# Patient Record
Sex: Female | Born: 1955
Health system: Southern US, Community
[De-identification: ages and names within clinical notes are randomized; demographics above are authoritative.]

## PROBLEM LIST (undated history)

## (undated) DIAGNOSIS — I341 Nonrheumatic mitral (valve) prolapse: Secondary | ICD-10-CM

## (undated) DIAGNOSIS — IMO0002 Reserved for concepts with insufficient information to code with codable children: Secondary | ICD-10-CM

## (undated) DIAGNOSIS — K219 Gastro-esophageal reflux disease without esophagitis: Secondary | ICD-10-CM

## (undated) DIAGNOSIS — M329 Systemic lupus erythematosus, unspecified: Secondary | ICD-10-CM

## (undated) DIAGNOSIS — G9050 Complex regional pain syndrome I, unspecified: Secondary | ICD-10-CM

---

## 2001-08-05 ENCOUNTER — Ambulatory Visit (HOSPITAL_COMMUNITY): Admission: RE | Admit: 2001-08-05 | Discharge: 2001-08-05 | Payer: Self-pay | Admitting: Family Medicine

## 2001-08-05 ENCOUNTER — Encounter: Payer: Self-pay | Admitting: Family Medicine

## 2001-09-13 ENCOUNTER — Encounter: Payer: Self-pay | Admitting: Family Medicine

## 2001-09-13 ENCOUNTER — Ambulatory Visit (HOSPITAL_COMMUNITY): Admission: RE | Admit: 2001-09-13 | Discharge: 2001-09-13 | Payer: Self-pay | Admitting: Family Medicine

## 2001-10-31 ENCOUNTER — Encounter: Payer: Self-pay | Admitting: Emergency Medicine

## 2001-10-31 ENCOUNTER — Emergency Department (HOSPITAL_COMMUNITY): Admission: EM | Admit: 2001-10-31 | Discharge: 2001-10-31 | Payer: Self-pay | Admitting: Emergency Medicine

## 2002-09-19 ENCOUNTER — Emergency Department (HOSPITAL_COMMUNITY): Admission: EM | Admit: 2002-09-19 | Discharge: 2002-09-19 | Payer: Self-pay | Admitting: Emergency Medicine

## 2004-04-29 ENCOUNTER — Inpatient Hospital Stay (HOSPITAL_COMMUNITY): Admission: EM | Admit: 2004-04-29 | Discharge: 2004-05-01 | Payer: Self-pay | Admitting: Emergency Medicine

## 2005-03-17 ENCOUNTER — Encounter: Admission: RE | Admit: 2005-03-17 | Discharge: 2005-03-17 | Payer: Self-pay | Admitting: *Deleted

## 2005-04-27 ENCOUNTER — Encounter: Admission: RE | Admit: 2005-04-27 | Discharge: 2005-04-27 | Payer: Self-pay | Admitting: Orthopedic Surgery

## 2005-05-27 ENCOUNTER — Ambulatory Visit (HOSPITAL_BASED_OUTPATIENT_CLINIC_OR_DEPARTMENT_OTHER): Admission: RE | Admit: 2005-05-27 | Discharge: 2005-05-27 | Payer: Self-pay | Admitting: Orthopedic Surgery

## 2005-10-13 ENCOUNTER — Encounter: Admission: RE | Admit: 2005-10-13 | Discharge: 2005-10-13 | Payer: Self-pay | Admitting: Orthopedic Surgery

## 2006-09-07 ENCOUNTER — Encounter: Admission: RE | Admit: 2006-09-07 | Discharge: 2006-09-07 | Payer: Self-pay | Admitting: Orthopedic Surgery

## 2007-01-19 IMAGING — RF DG FL GUIDE NDL PLMT  - NRPT [facility]
4 series · 4 of 4 positions shown · IV contrast (omniscan)
Comparison: none

CLINICAL DATA: Right wrist pain.  
 RIGHT WRIST INJECTION FOR MRI:
 The skin dorsal to the wrist was cleansed with Betadine and draped in a sterile fashion.  The patient has an allergy to anesthetic agents and none was given.  A 25 gauge needle was placed on one pass into radiocarpal joint between the navicular and the radial styloid.  Kye mixed with equal part normal saline and a few drops of Omniscan was injected to fill the radiocarpal joint.  Contrast flows rather freely into the distal radioulnar joint, indicating a triangular fibrocartilage tear.  No contrast penetrates the mid carpal joint at this point.

[Series 1: arthrogram · 1 of 1 slices shown (1 of 4)]
[im 1/1]
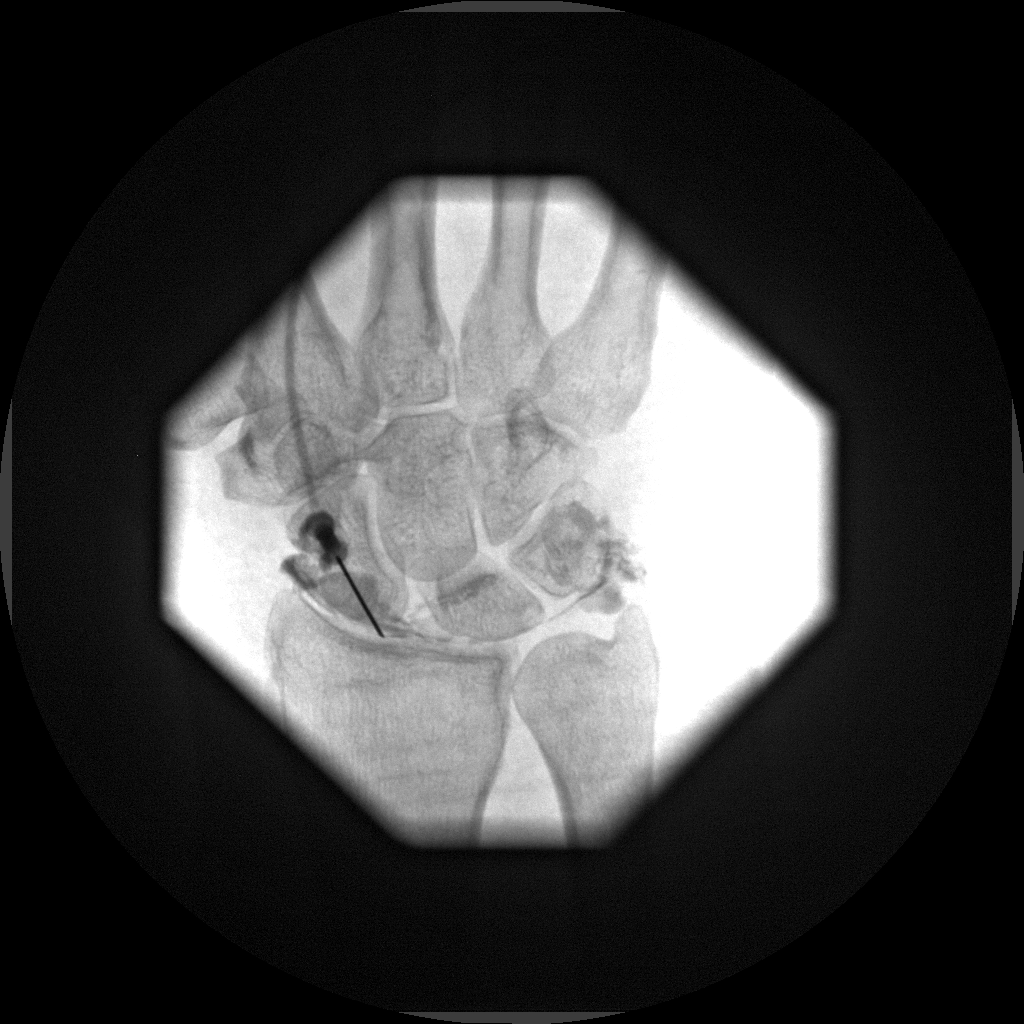

[Series 2: arthrogram · 1 of 1 slices shown (2 of 4)]
[im 1/1]
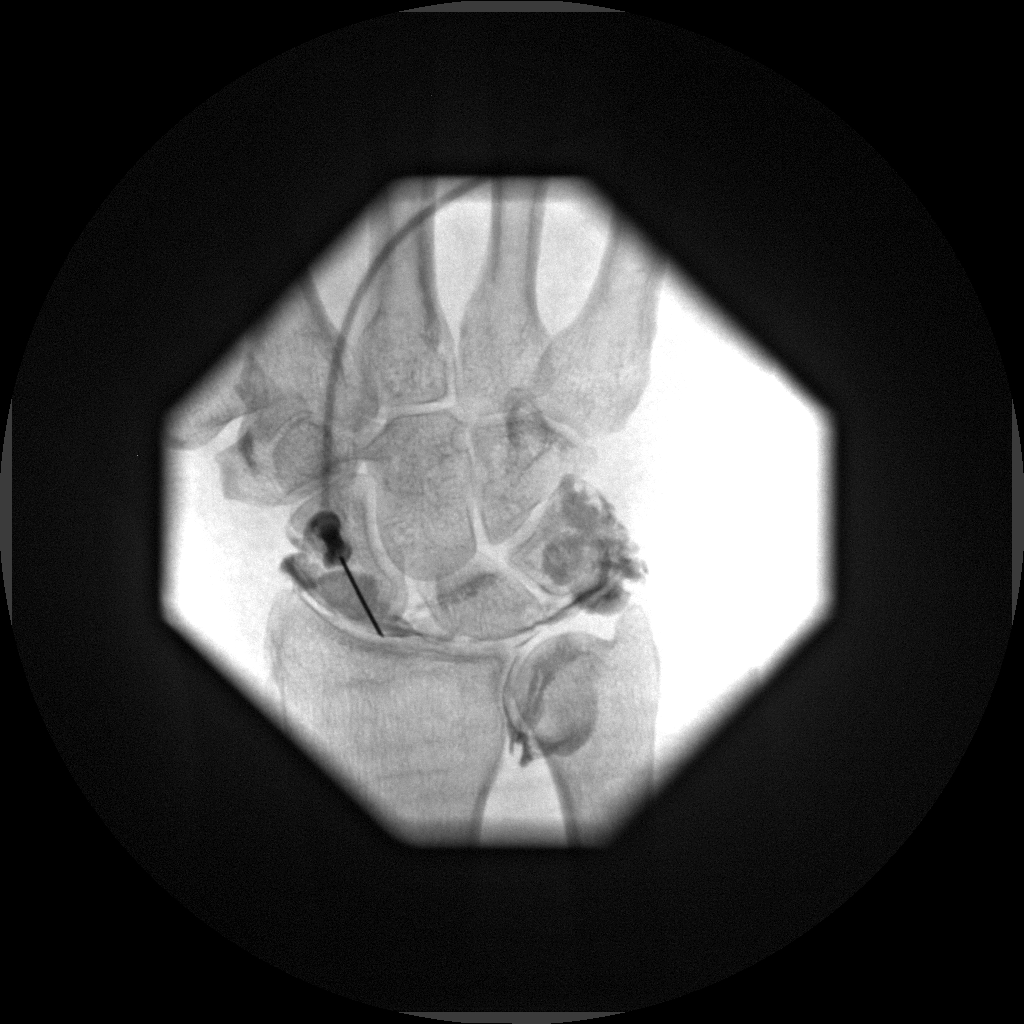

[Series 3: arthrogram · 1 of 1 slices shown (3 of 4)]
[im 1/1]
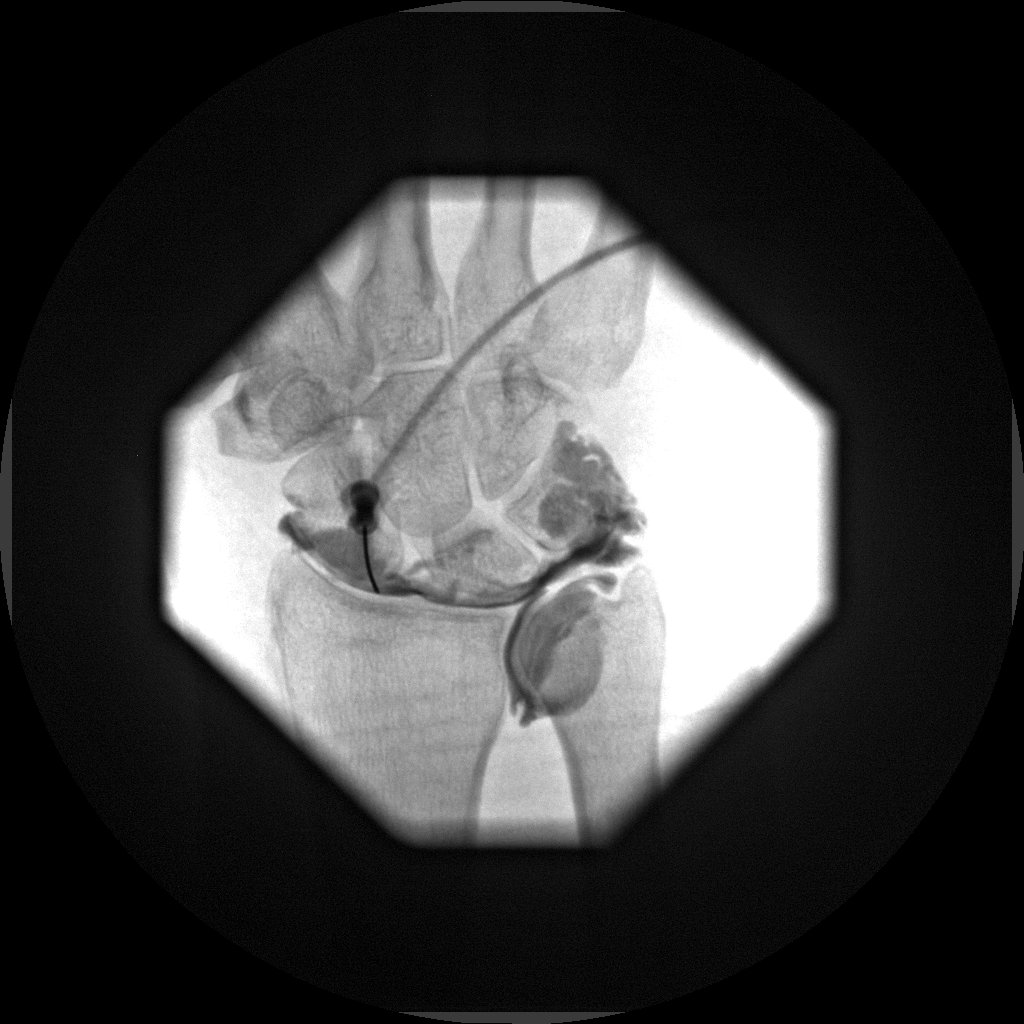

[Series 4: arthrogram · 1 of 1 slices shown (4 of 4)]
[im 1/1]
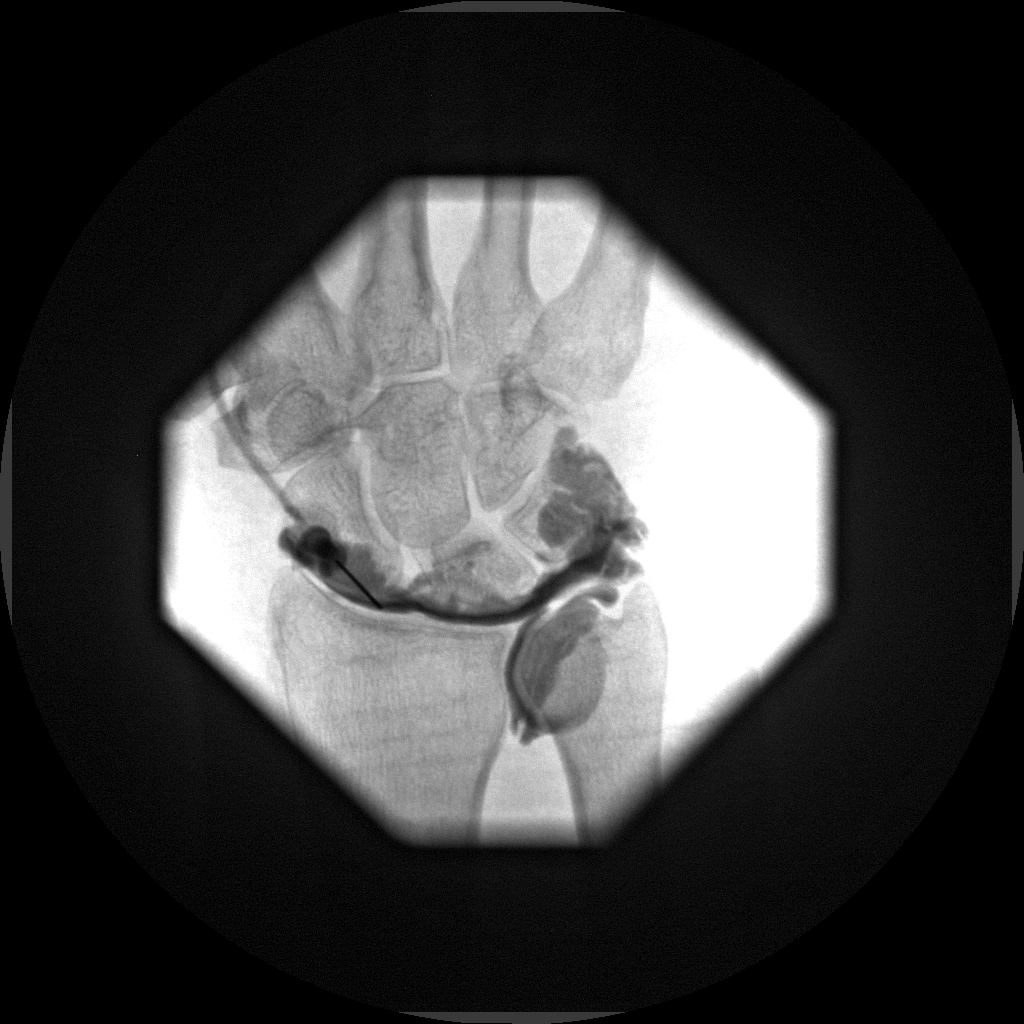

[4 of 4 positions shown; findings below may reference images not displayed]

IMPRESSION: Injection for MRI demonstrating a triangular fibrocartilage tear.

## 2009-10-24 ENCOUNTER — Ambulatory Visit (HOSPITAL_BASED_OUTPATIENT_CLINIC_OR_DEPARTMENT_OTHER): Admission: RE | Admit: 2009-10-24 | Discharge: 2009-10-24 | Payer: Self-pay | Admitting: Orthopedic Surgery

## 2010-05-15 LAB — POCT HEMOGLOBIN-HEMACUE: Hemoglobin: 13.8 g/dL (ref 12.0–15.0)

## 2010-05-15 LAB — BASIC METABOLIC PANEL
CO2: 30 mEq/L (ref 19–32)
GFR calc non Af Amer: >60 mL/min (ref >60)
Sodium: 139 mEq/L (ref 135–145)

## 2010-07-18 NOTE — Op Note (Signed)
NAMEKENNIDI, [name]              ACCOUNT NO.:  [id number]   MEDICAL RECORD NO.:  [id number]          PATIENT TYPE:  AMB   LOCATION:  DSC                          FACILITY:  [facility]   PHYSICIAN:  Loreta Ave, M.D. DATE OF BIRTH:  [birth year]   DATE OF PROCEDURE:  05/27/2005  DATE OF DISCHARGE:                                 OPERATIVE REPORT   PREOPERATIVE DIAGNOSIS:  Torn triangular fibrocartilage complex, right  wrist.   POSTOPERATIVE DIAGNOSIS:  Torn triangular fibrocartilage complex, right  wrist, with osteochondral fracture, 2-3 mm dorsal aspect of the lunate,  healed sprain scapholunate ligament with evident stability.   OPERATIVE PROCEDURE:  Right wrist exam under anesthesia, arthroscopy,  debridement of osteochondral injury lunate, removal loose body, debridement  of triangular fibrocartilage complex tear.   SURGEON:  Loreta Ave, M.D.   ASSISTANT:  Genene Churn. Barry Dienes, P.A.-C.   ANESTHESIA:  General.   ESTIMATED BLOOD LOSS:  Minimal.   SPECIMENS:  None.   CULTURES:  None.   COMPLICATIONS:  None.   DRESSINGS:  Soft compressive with a splint.   TOURNIQUET TIME:  50 minutes.   PROCEDURE:  The patient was brought to the operating room and after adequate  anesthesia had been obtained, the wrist was examined.  Good motion and  stability, no crepitus.  Tourniquet applied.  Prepped and draped in the  usual sterile fashion.  Distraction tower applied.  Exsanguinated with  elevation of Esmarch and tourniquet inflated to 250 mmHg.  15 pounds  distraction.  Two dorsal portals, one between the third and fourth  compartment, the other between the fifth and sixth compartment, avoiding  tendons and neurovascular structures.  The wrist entered with a blunt  obturator through both portals.  The arthroscope was introduced, wrist  distended and inspected.  2-3 mm partially tethered osteochondral injury  dorsal aspect of the lunate debrided.  Loose body removed.  Good  bleeding  bone at the base.  This was not repairable.  Remaining lunate looked  excellent.  Some hypertrophic changes scapholunate ligament but no  instability.  Some synovitis about the wrist debrided.  Triangular  fibrocartilage complex had roughening at the radial attachment debrided but  not a full thickness tear or separation there.  Centrally and toward the  ulnar styloid, there was a complex full thickness tear debrided out to  stable margins leaving a 3-4 mm defect centrally, but this was stable.  Through this, the end of the ulna inspected with some grade 3 changes  debrided.  The entire wrist examined and no other findings appreciated.  Instruments and fluid removed.  The portals  were closed with nylon.  A Sterile compressive dressing applied.  Tourniquet  deflated and removed.  Short arm splint applied.  Anesthesia reversed.  Brought to the recovery room.  Tolerated the surgery well without  complications.      Loreta Ave, M.D.  Electronically Signed     DFM/MEDQ  D:  05/27/2005  T:  05/28/2005  Job:  [id number]

## 2010-07-18 NOTE — Discharge Summary (Signed)
[name], [name]              ACCOUNT NO.:  [id number]   MEDICAL RECORD NO.:  [id number]          PATIENT TYPE:  INP   LOCATION:  0354                         FACILITY:  [facility]   PHYSICIAN:  Jackie Plum, M.D.DATE OF BIRTH:  1955-06-13   DATE OF ADMISSION:  04/29/2004  DATE OF DISCHARGE:                                 DISCHARGE SUMMARY   DISCHARGE DIAGNOSES:  1.  Hematochezia with abnormal CT scan, rule out colitis. CT scan of the      abdomen and pelvis obtained on April 29, 2004 indicated marked bowel      wall thickening associated with the distal transverse colon, splenic      flexure, and left colon.  2.  A 3.5 cm size cystic area noted in the right breast. Outpatient      mammogram to be scheduled for the patient and needs to be followed up by      the patient's primary care physician.  3.  A 2 cm left ovarian cyst per CT scan, stable; outpatient follow-up      recommended.  4.  Mild normocytic anemia secondary to diagnosis #1, stable; outpatient      follow-up recommended.  5.  History of mitral valve prolapse, reflex sympathetic dystrophy, asthma,      arthritis, torn rotator cuff, and previous history of myocardial      infarction.   DISCHARGE MEDICATIONS:  New medicines are:  1.  Nu-Iron 150 mg daily.  2.  Protonix 40 mg daily.   The patient is to resume all her preadmission medications.   DISCHARGE LABORATORY DATA:  Wbc count 12.6, hemoglobin 11.0, hematocrit  31.4, MCV 84.8, platelet count 388. Sodium 137, potassium 3.6, chloride 104,  CO2 27, glucose 122, BUN 4, creatinine 0.7, calcium 8.0.   PROCEDURES:  Not applicable.   CONSULTANTS:  Dr. Elnoria Howard of gastrointestinal medicine.   CONDITION ON DISCHARGE:  Improved and satisfactory. The patient is to follow  up with her PCP in 2-3 weeks. She is to call for appointment. She has been  instructed to report if she experiences any problems including fever,  abdominal pain, dark or bloody stools or  diarrhea.   REASON FOR HOSPITALIZATION:  Hematochezia.   The patient presented with bloody diarrhea which was preceded by nausea and  vomiting and some abdominal cramps. She has not had any sick contacts. There  was no known clear source of food poisoning. The patient had been treated  for sinusitis with Levaquin antibiotic. She does not have any history of  ulcerative colitis or Crohn's disease. According to the H&P by Dr. Jasmine Pang, the patient's blood pressure was 127/82, heart rate of 120,  respirations 20, saturation of 96% on room air. Her cardiac auscultation was  said to be regular rate and rhythm without any gallops or murmur. Lungs were  said to be clear to auscultation bilaterally and she was said to have a mild  tenderness in the left side with normal active bowel sounds. Her admitting  lab work was notable for BUN of 7, creatinine 0.8, and hemoglobin of 14 and  hematocrit of 33. Her platelet count was 352 and there was no coagulopathy.  CT scan of the abdomen showed bowel thickening. She was therefore admitted  for management of hematochezia.   HOSPITAL COURSE:  The patient was admitted to the hospitalist service. Her  H&H was serially monitored and there was no need for any blood transfusion.  IV fluids were given to the patient. She was monitored on telemetry. There  were no significant arrhythmias. The patient was seen in consultation by Dr.  Elnoria Howard of GI medicine. His impression was that this hematochezia may be  infectious etiology. He had offered the patient sigmoidoscopy but the  patient refused in view of concerns for her reflex sympathetic dystrophy. We  therefore obtained three sets of C. difficile testing - all of which were  negative. On rounds today, the patient feels fine and she is eager to come  home, does not have any more diarrhea or any bloody stools or melena. She  does not have any dizziness. She feels fine and does not have any weakness  at all. Her  vital signs indicates a blood pressure of 116/72, heart rate of  64, respirations 20, O2 saturation of 96% on room air. She had mild  conjunctival pallor without icterus. Lungs clear to auscultation. Abdomen is  soft, nontender. Extremities no cyanosis, no edema. She is therefore being  discharged home with outpatient follow-up. I had offered to put her on  Flagyl for presumptive C. difficile negative colitis but she declines, says  that antibiotics do not really go well with her. She has been made aware of  the fact that should she have any problems including fever or chills or  recurrence of her symptoms, she needs to call her doctor ASAP. The patient's  abnormal CT scan will need to followed, probably with a repeat CT in a  couple of weeks. She has incidental findings of breast cyst and ovarian cyst  which also needs to be followed at the outpatient level. The patient does  not have any acute complaints with respect to these incidental findings and  we believe that it will be okay for this to be followed up by her PCP, Dr.  Merri Brunette.      GO/MEDQ  D:  05/01/2004  T:  05/01/2004  Job:  [id number]   cc:   Jordan Hawks. Elnoria Howard, MD  Fax: [phone]   Dario Guardian, M.D.  [address]  [place]  [place] [address]  Fax: [phone]

## 2010-07-18 NOTE — Consult Note (Signed)
[name], [name]              ACCOUNT NO.:  [id number]   MEDICAL RECORD NO.:  [id number]          PATIENT TYPE:  INP   LOCATION:  0354                         FACILITY:  [facility]   PHYSICIAN:  Jordan Hawks. Elnoria Howard, MD    DATE OF BIRTH:  December 22, 1955   DATE OF CONSULTATION:  04/29/2004  DATE OF DISCHARGE:                                   CONSULTATION   REFERRING PHYSICIAN:  Theone Stanley, M.D.   REASON FOR CONSULTATION:  Bloody diarrhea.   HISTORY OF PRESENT ILLNESS:  This is a 55 year old white female with a past  medical history of reflux sympathetic dystrophy, mitral valve prolapse,  asthma, degenerative disk disease, torn rotator cuffs, and history of  myocardial infarction, who presents with an acute episode of bloody  diarrhea.  The patient states that one day prior to admission she  experienced some nausea and vomiting as well as abdominal cramps.  The  abdomina cramps subsequently resulted in diarrhea; however, it was only  during the day of admission that the patient was noted to have bloody  diarrhea.  She denies having any sick contacts, although she visits  frequently with her husband to her husband's physician's office.  She denies  any known food poisoning; however, she was with a group of friends and  family members at a restaurant on Sunday, but no other individuals were  affected after eating the food.  She states that the symptoms were preceded  by a flu type of symptomatology, manifesting as upper respiratory symptoms  as well as body aches.  She was recently treated with Levaquin for a  sinusitis on a 10-day course, approximately 1 to 1-1/2 weeks before this  admission.  She denies having any types of fever associated with the bloody  diarrhea, and she denies any family history of ulcerative colitis or Crohn's  disease.   PAST MEDICAL/SURGICAL HISTORY:  As stated above.   MEDICATIONS:  1.  Trazodone.  2.  Atenolol.  3.  Fexofenadine.  4.  Oxycodone.  5.   Theophylline.   ALLERGIES:  The patient has multiple allergies to ANTIBIOTICS which result  in tachycardia.   FAMILY HISTORY:  Significant for cardiac disease and diabetes.   SOCIAL HISTORY:  The patient lives with her husband in [place] and has  one child.  She denies any tobacco, alcohol, or illicit drug use.   REVIEW OF SYSTEMS:  As stated above.  Positive for generalized body aches.  No skin changes.  No chest pain.  No recent shortness of breath.  No  dysuria.  No arthralgias or myalgias.   PHYSICAL EXAMINATION:  VITAL SIGNS:  Blood pressure is 127/92, heart rate is  120, respirations 20, temperature is 96.1.  GENERAL:  Generally the patient appears to be fatigued and weak; however,  she is alert and oriented.  HEENT:  Normocephalic, atraumatic.  Extraocular muscles intact.  Pupils  equal, round, and reactive to light.  NECK:  Supple.  No lymphadenopathy.  LUNGS:  Clear to auscultation bilaterally.  CARDIOVASCULAR:  Regular rate and rhythm without murmurs, gallops, or rubs.  ABDOMEN:  Flat, soft.  Mild tenderness in the left upper quadrant and left  lower quadrant regions.  No rebound or rigidity.  Positive bowel sounds.  EXTREMITIES  No clubbing, cyanosis, or edema.  RECTAL:  Examination reveals gross blood on the examination finger, as well  as a thrombosed sternal hemorrhoid.  No palpable masses.   LABORATORY DATA:  White blood cell count is 13, hemoglobin 14.9, MCV 84.3,  platelets 352.  Sodium 137, potassium 3.7, chloride 104, CO2 23, glucose 99,  BUN 7, creatinine 0.8, albumin 3.6.  Urinalysis is negative.  AST is 18, ALT  less than 8, alkaline phosphatase 72.  Albumin is 3.6.   IMPRESSION:  1.  Hematochezia.  2.  Thrombosed external hemorrhoid.  3.  Reflux sympathetic dystrophy.  At the time I am unable to identify a      source for the acute bloody diarrhea; however, she is noted to have a      history of recent antibiotic use.  It is not unreasonable to  consider      the C. difficile colitis as a possibility of her resultant bloody      diarrhea.  The CT scan of the abdomen was reviewed and does reveal that      she has thickening of her distal transverse colon through the sigmoid.      The possibility of inflammatory bowel disease is entertained at this      time; however, given the acute nature, is likely to be infectious and      will need to be followed.  A colonoscopy versus flexible sigmoidoscopy      was offered to the patient for further evaluation of possible      inflammatory bowel disease; however, she declines at this time, stating      that her reflux sympathetic dystrophy is currently in a flare and she      does not feel that she has the strength to undergo the workup with a      colonoscopy.   PLAN:  1.  Continue with the stool studies.  2.  Monitor for any signs or symptoms of deterioration.  3.  Will follow along.      PDH/MEDQ  D:  04/29/2004  T:  04/29/2004  Job:  [id number]   cc:   Theone Stanley, MD   Cassell Clement, M.D.  [address], [address]  [place]  [place] [address]  Fax: [phone]

## 2010-07-18 NOTE — H&P (Signed)
[name], Colon              ACCOUNT NO.:  [id number]   MEDICAL RECORD NO.:  [id number]          PATIENT TYPE:  EMS   LOCATION:  ED                           FACILITY:  [facility]   PHYSICIAN:  Theone Stanley, MD   DATE OF BIRTH:  [birth year]   DATE OF ADMISSION:  04/29/2004  DATE OF DISCHARGE:                                HISTORY & PHYSICAL   CHIEF COMPLAINT:  Bloody diarrhea.   HISTORY OF PRESENT ILLNESS:  [name] is a 55 year old Caucasian  female presenting with what appears to be bloody diarrhea that occurred this  morning.  Her problem started about yesterday when she had some diarrhea,  vomiting, dry heaves.  She states this was liquidy brown, no froth, no  mucus.  At that time, she had a small amount of blood in the tissue where  she wiped herself, but nothing more.  This morning at about 9:30, she had an  episode of bloody diarrhea, this made her worry, she called her primary care  physician, Dr. Katrinka Blazing, and they stated to come to the ER.  The patient denies  being around anybody sick or eating any bad food.  This has never happened  to her before.  She recently came off Levaquin for a sinusitis infection a  week ago, she was on it for about a total of 10 days.   PAST MEDICAL HISTORY:  1.  Mitral valve prolapse.  2.  RSD.  3.  Asthma.  4.  Degenerative disk disease.  5.  Bilateral torn rotator cuffs.  6.  History of myocardial infarction.   PAST SURGICAL HISTORY:  None.   MEDICATIONS:  1.  Trazodone 50 mg one to two tabs q.h.s.  2.  Atenolol 50 mg b.i.d.  3.  Allegra 180 mg one daily.  4.  Oxycodone 10 to 30 mg q.4-6h.  5.  Theophylline 100 mg one p.o. daily.   ALLERGIES:  1.  TETRACYCLINE.  2.  PENICILLIN.  3.  MICIN GROUP OF ANTIBIOTICS.  With those, she becomes very tachycardic.  4.  OVER-THE-COUNTER DECONGESTANTS, THE ONLY THING SHE CAN TAKE IS SUDAFED.  5.  LIDOCAINE.  This causes her to break out.  6.  NOVOCAINE.  This causes her to break  out.   FAMILY HISTORY:  Significant for heart disease and diabetes.   SOCIAL HISTORY:  The patient is from [place], is married, has one grown  child who is well.  No tobacco, alcohol, or illicit drug use.   REVIEW OF SYSTEMS:  Please see HPI, otherwise negative.   PHYSICAL EXAMINATION:  VITAL SIGNS:  Blood pressure 127/92, heart rate of  120, respiratory rate of 20, saturation 96% on room air.  HEENT:  Head is normocephalic, atraumatic.  Eyes are 4 mm.  Pupils equal,  round, reactive to light.  Extraocular movements were intact.  Ears with no  discharge.  Throat clear.  No erythema.  NECK:  Supple, no lymphadenopathy, no JVD.  HEART:  Regular rate and rhythm, no murmurs, rubs, or gallops appreciated.  LUNGS:  Clear to auscultation bilaterally.  ABDOMEN:  Soft,  mild tenderness in the left side.  Positive bowel sounds.  EXTREMITIES:  No cyanosis, clubbing, or edema.  SKIN:  No rashes, lumps, or bumps.  NEUROLOGIC:  The patient is alert and oriented x3.  Nonfocal.  GENITOURINARY:  The patient had evidence of hemorrhoids.   LABORATORY DATA:  Sodium 137, potassium 3.7, chloride 104, CO2 23, glucose  99, BUN 7, creatinine 0.8, calcium 8.9, total protein 6.4, albumin 2.6.  AST  18, ALT less than 8, alkaline phosphatase 72, total bilirubin 0.9.  Urinalysis shows ketones at 40, negative nitrites, negative leukocyte  esterase.  White count at 13, hemoglobin of 14, hematocrit of 43, platelets  at 352.  The patient had a CAT scan of her abdomen which showed evidence of  colitis, a cyst in the breast, these are preliminary results.   ASSESSMENT AND PLAN:  1.  Bloody diarrhea.  Differential includes ulcerative colitis, Crohn's,      infectious colitis,  diverticular disease, pseudomembranous colitis,      viral gastroenteritis, irritable bowel syndrome, hemorrhoids.  At this      point in time, we will obtain stool studies, Clostridium difficile toxin      studies.  I have  contacted Dr.  Elnoria Howard of gastroenterology and he is aware      of the case.  There is no evidence of hemodynamic instability.  We will      repeat a hemoglobin and hematocrit this admission.  We will give her      fluids.  2.  Asthma.  We will continue her on her albuterol and theophylline.  We      will check a theophylline level.  3.  RDS.  The patient will need an air bed because apparently she is      susceptible to sores.  4.  Mitral valve prolapse.  The patient will be continued on her medication      atenolol.  5.  Prophylaxis:  We will start her on Protonix.      AEJ/MEDQ  D:  04/29/2004  T:  04/29/2004  Job:  [id number]   cc:   Cassell Clement, M.D.  [address]  [place]  [place] [address]  Fax: [phone]   Stacie Acres. White, M.D.  [address]  [place]  [place] [address]  Fax: [phone]

## 2010-12-16 ENCOUNTER — Encounter (HOSPITAL_BASED_OUTPATIENT_CLINIC_OR_DEPARTMENT_OTHER)
Admission: RE | Admit: 2010-12-16 | Discharge: 2010-12-16 | Disposition: A | Discharge status: Home / Self Care | Payer: Medicare Other | Source: Ambulatory Visit | Attending: Orthopedic Surgery | Admitting: Orthopedic Surgery

## 2010-12-16 LAB — BASIC METABOLIC PANEL
BUN: 11 mg/dL (ref 6–23)
Chloride: 103 mEq/L (ref 96–112)
Creatinine, Ser: 0.68 mg/dL (ref 0.50–1.10)
GFR calc Af Amer: >90 mL/min (ref >90)
GFR calc non Af Amer: >90 mL/min (ref >90)
Glucose, Bld: 92 mg/dL (ref 70–99)
Potassium: 4 mEq/L (ref 3.5–5.1)
Sodium: 139 mEq/L (ref 135–145)

## 2010-12-18 ENCOUNTER — Ambulatory Visit (HOSPITAL_BASED_OUTPATIENT_CLINIC_OR_DEPARTMENT_OTHER)
Admission: RE | Admit: 2010-12-18 | Discharge: 2010-12-18 | Disposition: A | Discharge status: Home / Self Care | Payer: Medicare Other | Source: Ambulatory Visit | Attending: Orthopedic Surgery | Admitting: Orthopedic Surgery

## 2010-12-18 DIAGNOSIS — G5630 Lesion of radial nerve, unspecified upper limb: Secondary | ICD-10-CM | POA: Insufficient documentation

## 2010-12-18 DIAGNOSIS — Z01812 Encounter for preprocedural laboratory examination: Secondary | ICD-10-CM | POA: Insufficient documentation

## 2010-12-18 DIAGNOSIS — Z01810 Encounter for preprocedural cardiovascular examination: Secondary | ICD-10-CM | POA: Insufficient documentation

## 2010-12-26 NOTE — Op Note (Signed)
  [name], BRACKNELL NO.:  [id number]  MEDICAL RECORD NO.:  [id number]  LOCATION:                                 FACILITY:  PHYSICIAN:  Loreta Ave, M.D.      DATE OF BIRTH:  DATE OF PROCEDURE:  12/18/2010 DATE OF DISCHARGE:                              OPERATIVE REPORT   PREOPERATIVE DIAGNOSIS:  Radial tunnel syndrome, proximal forearm, right.  POSTOPERATIVE DIAGNOSIS:  Radial tunnel syndrome, proximal forearm, right.  PROCEDURE:  Radial tunnel release.  SURGEON:  Loreta Ave, MD  ASSISTANT:  Genene Churn. Barry Dienes, PA-C  ANESTHESIA:  General.  BLOOD LOSS:  Minimal.  SPECIMENS:  None.  CULTURES:  None.  COMPLICATION:  None.  DRESSINGS:  Soft compressive.  TOURNIQUET TIME:  40 minutes.  PROCEDURE:  The patient was brought to the operating room and placed on the operating table in supine position.  After adequate anesthesia had been obtained, tourniquet applied apprised of the right arm.  Prepped and draped in usual sterile fashion.  Exsanguinated with elevation of Esmarch.  Tourniquet inflated to 250 mmHg.  A longitudinal incision of the volar aspect of the proximal forearm adjacent to the extensor muscles.  Skin and subcutaneous tissue were divided.  Blunt dissection were used to expose the border of the extensor was elevated exposing superficial branch to radial nerve.  This was traced back to the common radial nerve and then the motor branch followed distally.  Numerous crossing venous structures ligated with bipolar cautery.  Evidence of compression of the radial nerve at the arcade of [place] especially when the forearm was passively supinated in that interval.  Under direct visualization, I completely released the arcade and split the muscle distally more than 2-cm.  This obliterated all compression on the nerve. Protected throughout.  At completion, the entire motor and digital branch identified and in good condition with nice  thorough decompression throughout.  Wound irrigated.  Closed with subcutaneous subcuticular suture.  Sterile compressive dressing applied.  Anesthesia reversed after tourniquet inflated.  He was brought to recovery room, tolerated surgery well with no complications.     Loreta Ave, M.D.     DFM/MEDQ  D:  12/18/2010  T:  12/19/2010  Job:  [id number]  Electronically Signed by Mckinley Jewel M.D. on 12/26/2010 05:02:41 PM

## 2012-03-31 ENCOUNTER — Other Ambulatory Visit (HOSPITAL_COMMUNITY): Payer: Self-pay | Admitting: Family Medicine

## 2012-03-31 DIAGNOSIS — Z139 Encounter for screening, unspecified: Secondary | ICD-10-CM

## 2012-04-05 ENCOUNTER — Ambulatory Visit (HOSPITAL_COMMUNITY)
Admission: RE | Admit: 2012-04-05 | Discharge: 2012-04-05 | Disposition: A | Discharge status: Home / Self Care | Payer: Medicare PPO | Source: Ambulatory Visit | Attending: Family Medicine | Admitting: Family Medicine

## 2012-04-05 DIAGNOSIS — Z1231 Encounter for screening mammogram for malignant neoplasm of breast: Secondary | ICD-10-CM | POA: Insufficient documentation

## 2012-04-05 DIAGNOSIS — Z139 Encounter for screening, unspecified: Secondary | ICD-10-CM

## 2012-04-06 ENCOUNTER — Other Ambulatory Visit: Payer: Self-pay | Admitting: Family Medicine

## 2012-04-06 DIAGNOSIS — R928 Other abnormal and inconclusive findings on diagnostic imaging of breast: Secondary | ICD-10-CM

## 2012-04-20 ENCOUNTER — Ambulatory Visit (HOSPITAL_COMMUNITY)
Admission: RE | Admit: 2012-04-20 | Discharge: 2012-04-20 | Disposition: A | Discharge status: Home / Self Care | Payer: Medicare PPO | Source: Ambulatory Visit | Attending: Family Medicine | Admitting: Family Medicine

## 2012-04-20 ENCOUNTER — Other Ambulatory Visit: Payer: Self-pay | Admitting: Family Medicine

## 2012-04-20 DIAGNOSIS — R928 Other abnormal and inconclusive findings on diagnostic imaging of breast: Secondary | ICD-10-CM

## 2012-11-29 ENCOUNTER — Other Ambulatory Visit (HOSPITAL_COMMUNITY): Payer: Self-pay | Admitting: Family Medicine

## 2012-11-29 DIAGNOSIS — Z09 Encounter for follow-up examination after completed treatment for conditions other than malignant neoplasm: Secondary | ICD-10-CM

## 2012-11-29 DIAGNOSIS — N63 Unspecified lump in unspecified breast: Secondary | ICD-10-CM

## 2012-12-07 ENCOUNTER — Ambulatory Visit (HOSPITAL_COMMUNITY)
Admission: RE | Admit: 2012-12-07 | Discharge: 2012-12-07 | Disposition: A | Discharge status: Home / Self Care | Payer: Medicare PPO | Source: Ambulatory Visit | Attending: Family Medicine | Admitting: Family Medicine

## 2012-12-07 DIAGNOSIS — N63 Unspecified lump in unspecified breast: Secondary | ICD-10-CM | POA: Insufficient documentation

## 2012-12-07 DIAGNOSIS — Z09 Encounter for follow-up examination after completed treatment for conditions other than malignant neoplasm: Secondary | ICD-10-CM

## 2019-11-20 DIAGNOSIS — H5212 Myopia, left eye: Secondary | ICD-10-CM | POA: Diagnosis not present

## 2019-11-20 DIAGNOSIS — H5201 Hypermetropia, right eye: Secondary | ICD-10-CM | POA: Diagnosis not present

## 2019-11-20 DIAGNOSIS — H43811 Vitreous degeneration, right eye: Secondary | ICD-10-CM | POA: Diagnosis not present

## 2019-11-20 DIAGNOSIS — H524 Presbyopia: Secondary | ICD-10-CM | POA: Diagnosis not present

## 2019-11-20 DIAGNOSIS — H2512 Age-related nuclear cataract, left eye: Secondary | ICD-10-CM | POA: Diagnosis not present

## 2019-11-20 DIAGNOSIS — H02831 Dermatochalasis of right upper eyelid: Secondary | ICD-10-CM | POA: Diagnosis not present

## 2019-11-20 DIAGNOSIS — H52203 Unspecified astigmatism, bilateral: Secondary | ICD-10-CM | POA: Diagnosis not present

## 2019-11-20 DIAGNOSIS — H02834 Dermatochalasis of left upper eyelid: Secondary | ICD-10-CM | POA: Diagnosis not present

## 2019-11-20 DIAGNOSIS — Z961 Presence of intraocular lens: Secondary | ICD-10-CM | POA: Diagnosis not present

## 2020-01-18 DIAGNOSIS — J4530 Mild persistent asthma, uncomplicated: Secondary | ICD-10-CM | POA: Diagnosis not present

## 2020-01-18 DIAGNOSIS — E785 Hyperlipidemia, unspecified: Secondary | ICD-10-CM | POA: Diagnosis not present

## 2020-01-18 DIAGNOSIS — K219 Gastro-esophageal reflux disease without esophagitis: Secondary | ICD-10-CM | POA: Diagnosis not present

## 2020-01-18 DIAGNOSIS — G8921 Chronic pain due to trauma: Secondary | ICD-10-CM | POA: Diagnosis not present

## 2020-01-18 DIAGNOSIS — I10 Essential (primary) hypertension: Secondary | ICD-10-CM | POA: Diagnosis not present

## 2020-03-27 ENCOUNTER — Other Ambulatory Visit: Payer: Self-pay

## 2020-03-27 ENCOUNTER — Emergency Department (HOSPITAL_COMMUNITY)
Admission: EM | Admit: 2020-03-27 | Discharge: 2020-03-27 | Disposition: A | Discharge status: Home / Self Care | Payer: Medicare PPO | Attending: Emergency Medicine | Admitting: Emergency Medicine

## 2020-03-27 ENCOUNTER — Encounter (HOSPITAL_COMMUNITY): Payer: Self-pay | Admitting: Emergency Medicine

## 2020-03-27 ENCOUNTER — Emergency Department (HOSPITAL_COMMUNITY): Payer: Medicare PPO

## 2020-03-27 DIAGNOSIS — R112 Nausea with vomiting, unspecified: Secondary | ICD-10-CM | POA: Diagnosis not present

## 2020-03-27 DIAGNOSIS — N390 Urinary tract infection, site not specified: Secondary | ICD-10-CM | POA: Insufficient documentation

## 2020-03-27 DIAGNOSIS — U071 COVID-19: Secondary | ICD-10-CM | POA: Diagnosis not present

## 2020-03-27 DIAGNOSIS — E876 Hypokalemia: Secondary | ICD-10-CM | POA: Insufficient documentation

## 2020-03-27 DIAGNOSIS — J189 Pneumonia, unspecified organism: Secondary | ICD-10-CM | POA: Diagnosis not present

## 2020-03-27 DIAGNOSIS — Z743 Need for continuous supervision: Secondary | ICD-10-CM | POA: Diagnosis not present

## 2020-03-27 DIAGNOSIS — R110 Nausea: Secondary | ICD-10-CM | POA: Diagnosis not present

## 2020-03-27 DIAGNOSIS — R059 Cough, unspecified: Secondary | ICD-10-CM | POA: Diagnosis not present

## 2020-03-27 DIAGNOSIS — R0902 Hypoxemia: Secondary | ICD-10-CM | POA: Diagnosis not present

## 2020-03-27 DIAGNOSIS — M3210 Systemic lupus erythematosus, organ or system involvement unspecified: Secondary | ICD-10-CM | POA: Insufficient documentation

## 2020-03-27 HISTORY — DX: Gastro-esophageal reflux disease without esophagitis: K21.9

## 2020-03-27 HISTORY — DX: Systemic lupus erythematosus, unspecified: M32.9

## 2020-03-27 HISTORY — DX: Nonrheumatic mitral (valve) prolapse: I34.1

## 2020-03-27 HISTORY — DX: Complex regional pain syndrome I, unspecified: G90.50

## 2020-03-27 HISTORY — DX: Reserved for concepts with insufficient information to code with codable children: IMO0002

## 2020-03-27 LAB — COMPREHENSIVE METABOLIC PANEL
ALT: 65 U/L — ABNORMAL HIGH (ref 0–44)
AST: 65 U/L — ABNORMAL HIGH (ref 15–41)
Albumin: 3.9 g/dL (ref 3.5–5.0)
Alkaline Phosphatase: 64 U/L (ref 38–126)
Anion gap: 11 (ref 5–15)
BUN: 13 mg/dL (ref 8–23)
CO2: 25 mmol/L (ref 22–32)
Calcium: 8.8 mg/dL — ABNORMAL LOW (ref 8.9–10.3)
Chloride: 99 mmol/L (ref 98–111)
Creatinine, Ser: 0.74 mg/dL (ref 0.44–1.00)
GFR, Estimated: >60 mL/min (ref >60)
Glucose, Bld: 116 mg/dL — ABNORMAL HIGH (ref 70–99)
Potassium: 3.1 mmol/L — ABNORMAL LOW (ref 3.5–5.1)
Sodium: 135 mmol/L (ref 135–145)
Total Bilirubin: 0.8 mg/dL (ref 0.3–1.2)
Total Protein: 7.6 g/dL (ref 6.5–8.1)

## 2020-03-27 LAB — URINALYSIS, ROUTINE W REFLEX MICROSCOPIC
Bilirubin Urine: NEGATIVE
Glucose, UA: NEGATIVE mg/dL
Hgb urine dipstick: NEGATIVE
Ketones, ur: 20 mg/dL — AB
Nitrite: NEGATIVE
Protein, ur: 100 mg/dL — AB
Specific Gravity, Urine: 1.019 (ref 1.005–1.030)
WBC, UA: >50 WBC/hpf — ABNORMAL HIGH (ref 0–5)
pH: 6 (ref 5.0–8.0)

## 2020-03-27 LAB — CBC WITH DIFFERENTIAL/PLATELET
Abs Immature Granulocytes: 0.02 10*3/uL (ref 0.00–0.07)
Basophils Absolute: 0 10*3/uL (ref 0.0–0.1)
Basophils Relative: 0 %
Eosinophils Absolute: 0 10*3/uL (ref 0.0–0.5)
Eosinophils Relative: 0 %
HCT: 47.7 % — ABNORMAL HIGH (ref 36.0–46.0)
Hemoglobin: 15 g/dL (ref 12.0–15.0)
Immature Granulocytes: 0 %
Lymphocytes Relative: 19 %
Lymphs Abs: 0.9 10*3/uL (ref 0.7–4.0)
MCH: 27.9 pg (ref 26.0–34.0)
MCHC: 31.4 g/dL (ref 30.0–36.0)
MCV: 88.7 fL (ref 80.0–100.0)
Monocytes Absolute: 0.7 10*3/uL (ref 0.1–1.0)
Monocytes Relative: 14 %
Neutro Abs: 3.1 10*3/uL (ref 1.7–7.7)
Neutrophils Relative %: 67 %
Platelets: 298 10*3/uL (ref 150–400)
RBC: 5.38 MIL/uL — ABNORMAL HIGH (ref 3.87–5.11)
RDW: 13.9 % (ref 11.5–15.5)
WBC: 4.7 10*3/uL (ref 4.0–10.5)
nRBC: 0 % (ref 0.0–0.2)

## 2020-03-27 LAB — LACTIC ACID, PLASMA: Lactic Acid, Venous: 1.5 mmol/L (ref 0.5–1.9)

## 2020-03-27 MED ORDER — MAGNESIUM SULFATE 2 GM/50ML IV SOLN
2.0000 g | INTRAVENOUS | Status: AC
  Administered 2020-03-27: 2 g via INTRAVENOUS
  Filled 2020-03-27: qty 50

## 2020-03-27 MED ORDER — CEPHALEXIN 500 MG PO CAPS: 500.0000 mg | ORAL_CAPSULE | Freq: Two times a day (BID) | ORAL | 0 refills | Status: AC

## 2020-03-27 MED ORDER — POTASSIUM CHLORIDE 10 MEQ/100ML IV SOLN
10.0000 meq | Freq: Once | INTRAVENOUS | Status: AC
  Administered 2020-03-27: 10 meq via INTRAVENOUS
  Filled 2020-03-27: qty 100

## 2020-03-27 MED ORDER — SODIUM CHLORIDE 0.9 % IV SOLN
1.0000 g | Freq: Once | INTRAVENOUS | Status: AC
  Administered 2020-03-27: 1 g via INTRAVENOUS
  Filled 2020-03-27: qty 10

## 2020-03-27 MED ORDER — ACETAMINOPHEN 500 MG PO TABS
500.0000 mg | ORAL_TABLET | Freq: Once | ORAL | Status: AC
  Administered 2020-03-27: 500 mg via ORAL
  Filled 2020-03-27: qty 1

## 2020-03-27 MED ORDER — SODIUM CHLORIDE 0.9 % IV BOLUS
1000.0000 mL | Freq: Once | INTRAVENOUS | Status: AC
  Administered 2020-03-27: 1000 mL via INTRAVENOUS

## 2020-03-27 MED ORDER — ONDANSETRON HCL 4 MG/2ML IJ SOLN
4.0000 mg | Freq: Once | INTRAMUSCULAR | Status: AC
  Administered 2020-03-27: 4 mg via INTRAVENOUS
  Filled 2020-03-27: qty 2

## 2020-03-27 NOTE — ED Triage Notes (Addendum)
Pt c/o dehydration in the setting of covid for the last 10 days with N/V/D.  Pt was given a 500 ml Bolus in route via EMS.

## 2020-03-27 NOTE — Discharge Instructions (Addendum)
You have a urinary tract infection in addition to the Covid, Please take antibiotics for 7 days See your doctor for follow up.

## 2020-03-27 NOTE — ED Provider Notes (Signed)
[facility] EMERGENCY DEPARTMENT Provider Note   CSN: [id number] Arrival date & time: 03/27/20  1128     History Chief Complaint  Patient presents with  . Dehydration    [name] is a 65 y.o. female.  HPI    This patient is a 65 year old female, she has a history of lupus and unfortunately has many allergies. She presents to the hospital today with a complaint of ongoing Covid symptoms. She has had nausea and vomiting which has been the worst part of having Covid over the last 10 days despite initially starting with a sore throat, some runny nose and aches and pains. She has had intermittent fevers and over the last couple of days has had persistent and worsening nausea vomiting and 2 days of diarrhea which is watery. She has no abdominal pain, no chest pain and is not having coughing or shortness of breath. She does not have a headache. She has been taking over-the-counter nausea medications without relief and when she called her doctor's office they told her to come to the hospital for evaluation. She was unable to get vaccinated due to having so many allergies that her allergist told her not to  Past Medical History:  Diagnosis Date  . GERD (gastroesophageal reflux disease)   . Lupus (HCC)   . Mitral valve prolapse   . Reflex sympathetic dystrophy     There are no problems to display for this patient.   Past Surgical History:  Procedure Laterality Date  . ELBOW ARTHROCENTESIS     rt  . WRIST ARTHROCENTESIS     rt     OB History   No obstetric history on file.     History reviewed. No pertinent family history.  Social History   Tobacco Use  . Smoking status: Never Smoker  . Smokeless tobacco: Never Used  Vaping Use  . Vaping Use: Never used  Substance Use Topics  . Alcohol use: Not Currently  . Drug use: Not Currently    Home Medications Prior to Admission medications   Medication Sig Start Date End Date Taking? Authorizing Provider  cephALEXin  (KEFLEX) 500 MG capsule Take 1 capsule (500 mg total) by mouth 2 (two) times daily for 7 days. 03/27/20 04/03/20 Yes Eber Hong, MD    Allergies    Other, Ciprofloxacin, and Levofloxacin  Review of Systems   Review of Systems  All other systems reviewed and are negative.   Physical Exam Updated Vital Signs BP (!) 161/80 (BP Location: Right Arm)   Pulse 77   Temp 99.3 F (37.4 C) (Oral)   Resp 20   Ht 1.626 m (5\' 4" )   Wt 81.6 kg   SpO2 94%   BMI 30.90 kg/m   Physical Exam Vitals and nursing note reviewed.  Constitutional:      General: She is not in acute distress.    Appearance: She is well-developed and well-nourished.  HENT:     Head: Normocephalic and atraumatic.     Mouth/Throat:     Mouth: Oropharynx is clear and moist. Mucous membranes are dry.     Pharynx: No oropharyngeal exudate.  Eyes:     General: No scleral icterus.       Right eye: No discharge.        Left eye: No discharge.     Extraocular Movements: EOM normal.     Conjunctiva/sclera: Conjunctivae normal.     Pupils: Pupils are equal, round, and reactive to light.  Neck:  Thyroid: No thyromegaly.     Vascular: No JVD.  Cardiovascular:     Rate and Rhythm: Normal rate and regular rhythm.     Pulses: Intact distal pulses.     Heart sounds: Normal heart sounds. No murmur heard. No friction rub. No gallop.   Pulmonary:     Effort: Pulmonary effort is normal. No respiratory distress.     Breath sounds: Rales present. No wheezing.     Comments: Rales that clear with deep breathing Abdominal:     General: Bowel sounds are normal. There is no distension.     Palpations: Abdomen is soft. There is no mass.     Tenderness: There is no abdominal tenderness.  Musculoskeletal:        General: No tenderness or edema. Normal range of motion.     Cervical back: Normal range of motion and neck supple.  Lymphadenopathy:     Cervical: No cervical adenopathy.  Skin:    General: Skin is warm and dry.      Findings: No erythema or rash.  Neurological:     Mental Status: She is alert.     Coordination: Coordination normal.  Psychiatric:        Mood and Affect: Mood and affect normal.        Behavior: Behavior normal.     ED Results / Procedures / Treatments   Labs (all labs ordered are listed, but only abnormal results are displayed) Labs Reviewed  CBC WITH DIFFERENTIAL/PLATELET - Abnormal; Notable for the following components:      Result Value   RBC 5.38 (*)    HCT 47.7 (*)    All other components within normal limits  COMPREHENSIVE METABOLIC PANEL - Abnormal; Notable for the following components:   Potassium 3.1 (*)    Glucose, Bld 116 (*)    Calcium 8.8 (*)    AST 65 (*)    ALT 65 (*)    All other components within normal limits  URINALYSIS, ROUTINE W REFLEX MICROSCOPIC - Abnormal; Notable for the following components:   APPearance HAZY (*)    Ketones, ur 20 (*)    Protein, ur 100 (*)    Leukocytes,Ua LARGE (*)    WBC, UA >50 (*)    Bacteria, UA RARE (*)    Non Squamous Epithelial 0-5 (*)    All other components within normal limits  CULTURE, BLOOD (ROUTINE X 2)  URINE CULTURE  CULTURE, BLOOD (ROUTINE X 2)  LACTIC ACID, PLASMA    EKG None  Radiology DG Chest Port 1 View  Result Date: 03/27/2020 CLINICAL DATA:  Cough, recently COVID positive EXAM: PORTABLE CHEST 1 VIEW COMPARISON:  None. FINDINGS: Mild interstitial prominence. No significant pleural effusion. No pneumothorax. Cardiomediastinal contours are within normal limits with normal heart size. IMPRESSION: Age-indeterminate mild interstitial prominence may be chronic or reflect atypical/viral pneumonia. Electronically Signed   By: Guadlupe Spanish M.D.   On: 03/27/2020 16:44    Procedures Procedures   Medications Ordered in ED Medications  cefTRIAXone (ROCEPHIN) 1 g in sodium chloride 0.9 % 100 mL IVPB (has no administration in time range)  ondansetron (ZOFRAN) injection 4 mg (4 mg Intravenous Given 03/27/20  1737)  sodium chloride 0.9 % bolus 1,000 mL (0 mLs Intravenous Stopped 03/27/20 2302)  acetaminophen (TYLENOL) tablet 500 mg (500 mg Oral Given 03/27/20 1700)  magnesium sulfate IVPB 2 g 50 mL (0 g Intravenous Stopped 03/27/20 2302)  potassium chloride 10 mEq in 100 mL IVPB (0  mEq Intravenous Stopped 03/27/20 2302)    ED Course  I have reviewed the triage vital signs and the nursing notes.  Pertinent labs & imaging results that were available during my care of the patient were reviewed by me and considered in my medical decision making (see chart for details).  Clinical Course as of 03/27/20 2305  Wed Mar 27, 2020  1811 I have personally viewed the chest x-ray, there is not appear to be any focal infiltrate, possibly some early patchy infiltrate but nothing overwhelming.  Mediastinum is normal, skin and soft tissues are normal, no pneumothorax [BM]    Clinical Course User Index [BM] Eber Hong, MD   MDM Rules/Calculators/A&P                          This patient has COVID-19 based on a home test that she took last week, she is still febrile, she does not have any respiratory symptoms at this time but is primarily having nausea vomiting and now diarrhea. She looks weak, she looks dry but has a normal neurologic exam and has no focality. She has a temperature of 101.1, she will be given Tylenol, IV fluids, antiemetics and we will check labs to make sure there is no endorgan dysfunction or significant electrolyte abnormalities. The patient is agreeable. The rales did clear with deep breathing I suspect that there is no pneumonia but we will check with an x-ray  There is a urinary tract infection on urinalysis, patient feels much better after being treated for hypokalemia, she was given magnesium, potassium, fluids and feels much better.  Antibiotics are running, stable for discharge  Final Clinical Impression(s) / ED Diagnoses Final diagnoses:  Urinary tract infection without hematuria, site  unspecified  COVID-19    Rx / DC Orders ED Discharge Orders         Ordered    cephALEXin (KEFLEX) 500 MG capsule  2 times daily        03/27/20 2302           Eber Hong, MD 03/27/20 [phone]

## 2020-03-27 NOTE — ED Notes (Signed)
Patient assisted to restroom via wheelchair. Patient back in bed at this time. Patient asking for food. States that she has to leave by 2130 because her husband has had a stroke and can not drive and that she will not have a ride home.

## 2020-03-29 LAB — URINE CULTURE

## 2020-03-29 LAB — CULTURE, BLOOD (ROUTINE X 2)

## 2020-03-30 LAB — CULTURE, BLOOD (ROUTINE X 2)

## 2020-03-31 LAB — CULTURE, BLOOD (ROUTINE X 2)

## 2020-04-01 LAB — CULTURE, BLOOD (ROUTINE X 2)
Culture: NO GROWTH
Culture: NO GROWTH
Special Requests: ADEQUATE

## 2020-08-12 DIAGNOSIS — K219 Gastro-esophageal reflux disease without esophagitis: Secondary | ICD-10-CM | POA: Diagnosis not present

## 2020-08-12 DIAGNOSIS — F1190 Opioid use, unspecified, uncomplicated: Secondary | ICD-10-CM | POA: Diagnosis not present

## 2020-08-12 DIAGNOSIS — E785 Hyperlipidemia, unspecified: Secondary | ICD-10-CM | POA: Diagnosis not present

## 2020-08-12 DIAGNOSIS — G8921 Chronic pain due to trauma: Secondary | ICD-10-CM | POA: Diagnosis not present

## 2020-08-12 DIAGNOSIS — J4530 Mild persistent asthma, uncomplicated: Secondary | ICD-10-CM | POA: Diagnosis not present

## 2020-08-12 DIAGNOSIS — I10 Essential (primary) hypertension: Secondary | ICD-10-CM | POA: Diagnosis not present

## 2020-11-25 DIAGNOSIS — H43811 Vitreous degeneration, right eye: Secondary | ICD-10-CM | POA: Diagnosis not present

## 2020-11-25 DIAGNOSIS — Z961 Presence of intraocular lens: Secondary | ICD-10-CM | POA: Diagnosis not present

## 2020-11-25 DIAGNOSIS — H02834 Dermatochalasis of left upper eyelid: Secondary | ICD-10-CM | POA: Diagnosis not present

## 2020-11-25 DIAGNOSIS — H524 Presbyopia: Secondary | ICD-10-CM | POA: Diagnosis not present

## 2020-11-25 DIAGNOSIS — H2512 Age-related nuclear cataract, left eye: Secondary | ICD-10-CM | POA: Diagnosis not present

## 2020-11-25 DIAGNOSIS — H02831 Dermatochalasis of right upper eyelid: Secondary | ICD-10-CM | POA: Diagnosis not present

## 2020-11-25 DIAGNOSIS — H5212 Myopia, left eye: Secondary | ICD-10-CM | POA: Diagnosis not present

## 2020-11-27 DIAGNOSIS — M170 Bilateral primary osteoarthritis of knee: Secondary | ICD-10-CM | POA: Diagnosis not present

## 2020-11-27 DIAGNOSIS — M25572 Pain in left ankle and joints of left foot: Secondary | ICD-10-CM | POA: Diagnosis not present

## 2020-12-19 DIAGNOSIS — M79672 Pain in left foot: Secondary | ICD-10-CM | POA: Diagnosis not present

## 2020-12-25 DIAGNOSIS — M79672 Pain in left foot: Secondary | ICD-10-CM | POA: Diagnosis not present

## 2021-01-14 DIAGNOSIS — H2512 Age-related nuclear cataract, left eye: Secondary | ICD-10-CM | POA: Diagnosis not present

## 2021-02-11 DIAGNOSIS — J4530 Mild persistent asthma, uncomplicated: Secondary | ICD-10-CM | POA: Diagnosis not present

## 2021-02-11 DIAGNOSIS — I10 Essential (primary) hypertension: Secondary | ICD-10-CM | POA: Diagnosis not present

## 2021-02-11 DIAGNOSIS — K219 Gastro-esophageal reflux disease without esophagitis: Secondary | ICD-10-CM | POA: Diagnosis not present

## 2021-02-11 DIAGNOSIS — E785 Hyperlipidemia, unspecified: Secondary | ICD-10-CM | POA: Diagnosis not present

## 2021-02-11 DIAGNOSIS — Z1211 Encounter for screening for malignant neoplasm of colon: Secondary | ICD-10-CM | POA: Diagnosis not present

## 2021-02-11 DIAGNOSIS — F1190 Opioid use, unspecified, uncomplicated: Secondary | ICD-10-CM | POA: Diagnosis not present

## 2021-02-11 DIAGNOSIS — G8921 Chronic pain due to trauma: Secondary | ICD-10-CM | POA: Diagnosis not present

## 2021-03-14 DIAGNOSIS — N3000 Acute cystitis without hematuria: Secondary | ICD-10-CM | POA: Diagnosis not present

## 2021-08-12 DIAGNOSIS — J4530 Mild persistent asthma, uncomplicated: Secondary | ICD-10-CM | POA: Diagnosis not present

## 2021-08-12 DIAGNOSIS — E785 Hyperlipidemia, unspecified: Secondary | ICD-10-CM | POA: Diagnosis not present

## 2021-08-12 DIAGNOSIS — I10 Essential (primary) hypertension: Secondary | ICD-10-CM | POA: Diagnosis not present

## 2021-08-12 DIAGNOSIS — K219 Gastro-esophageal reflux disease without esophagitis: Secondary | ICD-10-CM | POA: Diagnosis not present

## 2021-08-12 DIAGNOSIS — G8921 Chronic pain due to trauma: Secondary | ICD-10-CM | POA: Diagnosis not present

## 2022-02-11 DIAGNOSIS — G8921 Chronic pain due to trauma: Secondary | ICD-10-CM | POA: Diagnosis not present

## 2022-02-11 DIAGNOSIS — I10 Essential (primary) hypertension: Secondary | ICD-10-CM | POA: Diagnosis not present

## 2022-02-11 DIAGNOSIS — J4530 Mild persistent asthma, uncomplicated: Secondary | ICD-10-CM | POA: Diagnosis not present

## 2022-02-11 DIAGNOSIS — F1190 Opioid use, unspecified, uncomplicated: Secondary | ICD-10-CM | POA: Diagnosis not present

## 2022-10-06 DIAGNOSIS — F1190 Opioid use, unspecified, uncomplicated: Secondary | ICD-10-CM | POA: Diagnosis not present

## 2022-10-06 DIAGNOSIS — Z1331 Encounter for screening for depression: Secondary | ICD-10-CM | POA: Diagnosis not present

## 2022-10-06 DIAGNOSIS — K219 Gastro-esophageal reflux disease without esophagitis: Secondary | ICD-10-CM | POA: Diagnosis not present

## 2022-10-06 DIAGNOSIS — Z0000 Encounter for general adult medical examination without abnormal findings: Secondary | ICD-10-CM | POA: Diagnosis not present

## 2022-10-06 DIAGNOSIS — I10 Essential (primary) hypertension: Secondary | ICD-10-CM | POA: Diagnosis not present

## 2022-10-06 DIAGNOSIS — J4530 Mild persistent asthma, uncomplicated: Secondary | ICD-10-CM | POA: Diagnosis not present

## 2022-10-06 DIAGNOSIS — G8921 Chronic pain due to trauma: Secondary | ICD-10-CM | POA: Diagnosis not present

## 2022-10-06 DIAGNOSIS — E785 Hyperlipidemia, unspecified: Secondary | ICD-10-CM | POA: Diagnosis not present

## 2023-03-29 DIAGNOSIS — B372 Candidiasis of skin and nail: Secondary | ICD-10-CM | POA: Diagnosis not present

## 2023-03-29 DIAGNOSIS — J189 Pneumonia, unspecified organism: Secondary | ICD-10-CM | POA: Diagnosis not present

## 2023-03-29 DIAGNOSIS — R52 Pain, unspecified: Secondary | ICD-10-CM | POA: Diagnosis not present

## 2023-03-29 DIAGNOSIS — R5383 Other fatigue: Secondary | ICD-10-CM | POA: Diagnosis not present

## 2023-03-29 DIAGNOSIS — R059 Cough, unspecified: Secondary | ICD-10-CM | POA: Diagnosis not present

## 2023-03-29 DIAGNOSIS — R509 Fever, unspecified: Secondary | ICD-10-CM | POA: Diagnosis not present

## 2023-04-02 DIAGNOSIS — J189 Pneumonia, unspecified organism: Secondary | ICD-10-CM | POA: Diagnosis not present

## 2023-04-06 DIAGNOSIS — J189 Pneumonia, unspecified organism: Secondary | ICD-10-CM | POA: Diagnosis not present

## 2023-04-12 DIAGNOSIS — E785 Hyperlipidemia, unspecified: Secondary | ICD-10-CM | POA: Diagnosis not present

## 2023-04-12 DIAGNOSIS — I10 Essential (primary) hypertension: Secondary | ICD-10-CM | POA: Diagnosis not present

## 2023-04-12 DIAGNOSIS — F1190 Opioid use, unspecified, uncomplicated: Secondary | ICD-10-CM | POA: Diagnosis not present

## 2023-04-12 DIAGNOSIS — G8921 Chronic pain due to trauma: Secondary | ICD-10-CM | POA: Diagnosis not present

## 2023-04-12 DIAGNOSIS — K219 Gastro-esophageal reflux disease without esophagitis: Secondary | ICD-10-CM | POA: Diagnosis not present

## 2023-04-12 DIAGNOSIS — J4530 Mild persistent asthma, uncomplicated: Secondary | ICD-10-CM | POA: Diagnosis not present

## 2023-11-18 DIAGNOSIS — G8921 Chronic pain due to trauma: Secondary | ICD-10-CM | POA: Diagnosis not present

## 2023-11-18 DIAGNOSIS — E785 Hyperlipidemia, unspecified: Secondary | ICD-10-CM | POA: Diagnosis not present

## 2023-11-18 DIAGNOSIS — Z0000 Encounter for general adult medical examination without abnormal findings: Secondary | ICD-10-CM | POA: Diagnosis not present

## 2023-11-18 DIAGNOSIS — J4530 Mild persistent asthma, uncomplicated: Secondary | ICD-10-CM | POA: Diagnosis not present

## 2023-11-18 DIAGNOSIS — K219 Gastro-esophageal reflux disease without esophagitis: Secondary | ICD-10-CM | POA: Diagnosis not present

## 2023-11-18 DIAGNOSIS — Z1331 Encounter for screening for depression: Secondary | ICD-10-CM | POA: Diagnosis not present

## 2023-11-18 DIAGNOSIS — F1190 Opioid use, unspecified, uncomplicated: Secondary | ICD-10-CM | POA: Diagnosis not present

## 2023-11-18 DIAGNOSIS — I10 Essential (primary) hypertension: Secondary | ICD-10-CM | POA: Diagnosis not present
